# Patient Record
Sex: Female | Born: 1995 | Hispanic: Yes | Marital: Single | Smoking: Never smoker
Health system: Southern US, Community
[De-identification: ages and names within clinical notes are randomized; demographics above are authoritative.]

---
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
## 2016-01-20 ENCOUNTER — Emergency Department (HOSPITAL_COMMUNITY): Payer: Commercial Managed Care - PPO

## 2016-01-20 ENCOUNTER — Encounter (HOSPITAL_COMMUNITY): Payer: Self-pay | Admitting: Emergency Medicine

## 2016-01-20 ENCOUNTER — Emergency Department (HOSPITAL_COMMUNITY)
Admission: EM | Admit: 2016-01-20 | Discharge: 2016-01-21 | Disposition: A | Discharge status: Home / Self Care | Payer: Commercial Managed Care - PPO | Attending: Emergency Medicine | Admitting: Emergency Medicine

## 2016-01-20 DIAGNOSIS — S098XXA Other specified injuries of head, initial encounter: Secondary | ICD-10-CM | POA: Diagnosis not present

## 2016-01-20 DIAGNOSIS — S161XXA Strain of muscle, fascia and tendon at neck level, initial encounter: Secondary | ICD-10-CM | POA: Diagnosis not present

## 2016-01-20 DIAGNOSIS — S0993XA Unspecified injury of face, initial encounter: Secondary | ICD-10-CM

## 2016-01-20 DIAGNOSIS — S3991XA Unspecified injury of abdomen, initial encounter: Secondary | ICD-10-CM | POA: Diagnosis not present

## 2016-01-20 DIAGNOSIS — R079 Chest pain, unspecified: Secondary | ICD-10-CM | POA: Insufficient documentation

## 2016-01-20 DIAGNOSIS — S298XXA Other specified injuries of thorax, initial encounter: Secondary | ICD-10-CM

## 2016-01-20 DIAGNOSIS — S060X1A Concussion with loss of consciousness of 30 minutes or less, initial encounter: Secondary | ICD-10-CM | POA: Diagnosis not present

## 2016-01-20 DIAGNOSIS — Y999 Unspecified external cause status: Secondary | ICD-10-CM | POA: Diagnosis not present

## 2016-01-20 DIAGNOSIS — S0990XA Unspecified injury of head, initial encounter: Secondary | ICD-10-CM | POA: Diagnosis present

## 2016-01-20 DIAGNOSIS — Y9389 Activity, other specified: Secondary | ICD-10-CM | POA: Insufficient documentation

## 2016-01-20 DIAGNOSIS — Y92410 Unspecified street and highway as the place of occurrence of the external cause: Secondary | ICD-10-CM | POA: Insufficient documentation

## 2016-01-20 LAB — I-STAT CHEM 8, ED
BUN: 11 mg/dL (ref 6–20)
CALCIUM ION: 1.16 mmol/L (ref 1.13–1.30)
CHLORIDE: 106 mmol/L (ref 101–111)
CREATININE: 0.8 mg/dL (ref 0.44–1.00)
GLUCOSE: 89 mg/dL (ref 65–99)
HCT: 39 % (ref 36.0–46.0)
Hemoglobin: 13.3 g/dL (ref 12.0–15.0)
Potassium: 3.7 mmol/L (ref 3.5–5.1)
SODIUM: 141 mmol/L (ref 135–145)
TCO2: 26 mmol/L (ref 0–100)

## 2016-01-20 MED ORDER — TETANUS-DIPHTH-ACELL PERTUSSIS 5-2.5-18.5 LF-MCG/0.5 IM SUSP
0.5000 mL | Freq: Once | INTRAMUSCULAR | Status: AC
  Administered 2016-01-20: 0.5 mL via INTRAMUSCULAR
  Filled 2016-01-20: qty 0.5
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
## 2016-01-21 LAB — I-STAT BETA HCG BLOOD, ED (MC, WL, AP ONLY)

## 2016-01-21 MED ORDER — ONDANSETRON HCL 4 MG/2ML IJ SOLN
4.0000 mg | Freq: Once | INTRAMUSCULAR | Status: AC
  Administered 2016-01-21: 4 mg via INTRAVENOUS
  Filled 2016-01-21: qty 2

## 2016-01-21 MED ORDER — FENTANYL CITRATE (PF) 100 MCG/2ML IJ SOLN
50.0000 ug | Freq: Once | INTRAMUSCULAR | Status: AC
  Administered 2016-01-21: 50 ug via INTRAVENOUS
  Filled 2016-01-21: qty 2

## 2016-01-21 MED ORDER — IOPAMIDOL (ISOVUE-300) INJECTION 61%
100.0000 mL | Freq: Once | INTRAVENOUS | Status: AC | PRN
  Administered 2016-01-21: 100 mL via INTRAVENOUS

## 2016-01-21 NOTE — ED Provider Notes (Signed)
Pt stable All imaging negative She is awake/alert, GCS 15 She is ambulatory No signs of facial trauma Denies dental injury Will d/c home Discussed return precautions   Zadie Rhineonald Smiley Birr, MD 01/21/16 [phone]

## 2016-01-21 NOTE — ED Notes (Signed)
Patient given paper scrubs.
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
## 2016-01-21 NOTE — ED Notes (Signed)
Patient feels nauseated. 
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
## 2018-12-18 ENCOUNTER — Emergency Department (HOSPITAL_COMMUNITY)
Admission: EM | Admit: 2018-12-18 | Discharge: 2018-12-18 | Disposition: A | Discharge status: Home / Self Care | Payer: Medicaid Other | Attending: Emergency Medicine | Admitting: Emergency Medicine

## 2018-12-18 ENCOUNTER — Encounter (HOSPITAL_COMMUNITY): Payer: Self-pay | Admitting: Emergency Medicine

## 2018-12-18 ENCOUNTER — Other Ambulatory Visit: Payer: Self-pay

## 2018-12-18 DIAGNOSIS — L299 Pruritus, unspecified: Secondary | ICD-10-CM | POA: Insufficient documentation

## 2018-12-18 DIAGNOSIS — R21 Rash and other nonspecific skin eruption: Secondary | ICD-10-CM | POA: Diagnosis not present

## 2018-12-18 MED ORDER — PREDNISONE 10 MG PO TABS: 40.0000 mg | ORAL_TABLET | Freq: Every day | ORAL | 0 refills | Status: AC

## 2018-12-18 MED ORDER — FAMOTIDINE 20 MG PO TABS: 20.0000 mg | ORAL_TABLET | Freq: Two times a day (BID) | ORAL | 0 refills | Status: AC | PRN

## 2018-12-18 MED ORDER — DIPHENHYDRAMINE HCL 25 MG PO TABS: 25.0000 mg | ORAL_TABLET | Freq: Four times a day (QID) | ORAL | 0 refills | Status: AC | PRN

## 2018-12-18 MED ORDER — PREDNISONE 20 MG PO TABS
60.0000 mg | ORAL_TABLET | Freq: Once | ORAL | Status: AC
  Administered 2018-12-18: 22:00:00 60 mg via ORAL
  Filled 2018-12-18: qty 3

## 2018-12-18 NOTE — ED Provider Notes (Signed)
[facility] EMERGENCY DEPARTMENT Provider Note   CSN: [id number] Arrival date & time: 12/18/18  2025    History   Chief Complaint Chief Complaint  Patient presents with  . Rash    HPI Megan Callahan is a 23 y.o. female presents for evaluation of acute onset, progressively worsening pruritic rash for 1 week.  She notes that the rash began around her cheeks and has since spread to her ears bilaterally.  Denies any pain or drainage.  No fevers or chills.  Denies facial swelling, difficulty breathing or swallowing, wheezing, shortness of breath, chest pain, abdominal pain, nausea, or vomiting.  She has applied a topical Benadryl cream without significant relief.  She denies any new soaps, shampoos, detergents, lotions, medications, or new food exposures.  No one at home has a similar rash.  She does note that her employer started using a new cleaning agent and she did touch her face after using it but is unsure if it is related.     The history is provided by the patient.    History reviewed. No pertinent past medical history.  There are no active problems to display for this patient.   History reviewed. No pertinent surgical history.   OB History   No obstetric history on file.      Home Medications    Prior to Admission medications   Medication Sig Start Date End Date Taking? Authorizing Provider  diphenhydrAMINE (BENADRYL) 25 MG tablet Take 1 tablet (25 mg total) by mouth every 6 (six) hours as needed for itching. 12/18/18   Luevenia Maxin, Dominic Rhome A, PA-C  famotidine (PEPCID) 20 MG tablet Take 1 tablet (20 mg total) by mouth 2 (two) times daily as needed (itching). 12/18/18   Shivaay Stormont A, PA-C  predniSONE (DELTASONE) 10 MG tablet Take 4 tablets (40 mg total) by mouth daily for 5 days. 12/18/18 12/23/18  Jeanie Sewer, PA-C    Family History No family history on file.  Social History Social History   Tobacco Use  . Smoking status: Never Smoker  . Smokeless  tobacco: Never Used  Substance Use Topics  . Alcohol use: Never    Frequency: Never  . Drug use: Never     Allergies   Patient has no known allergies.   Review of Systems Review of Systems  Constitutional: Negative for fever.  HENT: Negative for sore throat, trouble swallowing and voice change.   Cardiovascular: Negative for chest pain.  Gastrointestinal: Negative for abdominal pain, nausea and vomiting.  Skin: Positive for rash.     Physical Exam Updated Vital Signs BP 130/74   Pulse 81   Temp 98.3 F (36.8 C)   Resp 16   Ht 5' 7.5" (1.715 m)   Wt 86.2 kg   SpO2 100%   BMI 29.32 kg/m   Physical Exam Vitals signs and nursing note reviewed.  Constitutional:      General: She is not in acute distress.    Appearance: She is well-developed.  HENT:     Head: Normocephalic and atraumatic.     Mouth/Throat:     Comments: No periorbital or tongue swelling, no upper airway stridor.  No abnormal phonation.  Tolerating secretions that difficulty.  No uvular swelling Eyes:     General:        Right eye: No discharge.        Left eye: No discharge.     Conjunctiva/sclera: Conjunctivae normal.  Neck:     Vascular:  No JVD.     Trachea: No tracheal deviation.  Cardiovascular:     Rate and Rhythm: Normal rate and regular rhythm.  Pulmonary:     Effort: Pulmonary effort is normal.     Breath sounds: Normal breath sounds. No wheezing.  Abdominal:     General: There is no distension.     Palpations: Abdomen is soft.     Tenderness: There is no abdominal tenderness. There is no guarding or rebound.  Skin:    General: Skin is warm and dry.     Findings: Erythema and rash present.     Comments: See below images.  Patient with an erythematous scaly rash localized to the ears and cheeks and chin bilaterally.  No desquamation, no vesicles, no bulla.  Nikolsky sign absent.  Neurological:     Mental Status: She is alert.  Psychiatric:        Behavior: Behavior normal.             ED Treatments / Results  Labs (all labs ordered are listed, but only abnormal results are displayed) Labs Reviewed - No data to display  EKG None  Radiology No results found.  Procedures Procedures (including critical care time)  Medications Ordered in ED Medications  predniSONE (DELTASONE) tablet 60 mg (60 mg Oral Given 12/18/18 2147)     Initial Impression / Assessment and Plan / ED Course  I have reviewed the triage vital signs and the nursing notes.  Pertinent labs & imaging results that were available during my care of the patient were reviewed by me and considered in my medical decision making (see chart for details).        Rash suggestive of allergic reaction versus contact dermatitis.  Patient is afebrile, vital signs are stable.  She is nontoxic in appearance.  Patient denies any difficulty breathing or swallowing.  Patient has a patent airway without stridor and is handling secretions without difficulty; no angioedema. No blisters, no pustules, no warmth, no draining sinus tracts, no superficial abscesses, no bullous impetigo, no vesicles, no desquamation, no target lesions with dusky purpura or a central bulla. Not tender to touch. No concern for superimposed infection. No concern for SJS, TEN, TSS, tick borne illness, syphilis or other life-threatening condition. Will discharge home with short course of steroids, pepcid and recommend Benadryl as needed for pruritis.  Discussed strict ED return precautions.  Patient verbalized understanding of and agreement with plan and patient stable for discharge home at this time.  Final Clinical Impressions(s) / ED Diagnoses   Final diagnoses:  Rash    ED Discharge Orders         Ordered    predniSONE (DELTASONE) 10 MG tablet  Daily     12/18/18 2142    diphenhydrAMINE (BENADRYL) 25 MG tablet  Every 6 hours PRN     12/18/18 2142    famotidine (PEPCID) 20 MG tablet  2 times daily PRN     12/18/18 2142            Jeanie SewerFawze, Carlie Corpus A, PA-C 12/18/18 2256    Cathren LaineSteinl, Kevin, MD 12/19/18 1512

## 2018-12-18 NOTE — Discharge Instructions (Signed)
Start taking prednisone as prescribed beginning tomorrow.  You received the first dose in the emergency department today.  You can take Benadryl every 6 hours as needed for itching.  Do not drive if this medication makes you drowsy.  You can also take Pepcid twice daily if the itching is still persistent.  Avoid use of any irritants.  Follow-up with a primary care physician for reevaluation of symptoms if they persist.  Return to the emergency department immediately if any concerning signs or symptoms develop such as facial swelling, difficulty breathing or swallowing, wheezing

## 2018-12-18 NOTE — ED Triage Notes (Signed)
Reports rash on face going around to ears for the last week.

## 2019-01-17 ENCOUNTER — Encounter (HOSPITAL_COMMUNITY): Payer: Self-pay | Admitting: *Deleted

## 2019-01-17 ENCOUNTER — Emergency Department (HOSPITAL_COMMUNITY)
Admission: EM | Admit: 2019-01-17 | Discharge: 2019-01-17 | Disposition: A | Discharge status: Home / Self Care | Payer: Medicaid Other | Attending: Emergency Medicine | Admitting: Emergency Medicine

## 2019-01-17 ENCOUNTER — Other Ambulatory Visit: Payer: Self-pay

## 2019-01-17 DIAGNOSIS — Z20828 Contact with and (suspected) exposure to other viral communicable diseases: Secondary | ICD-10-CM | POA: Diagnosis not present

## 2019-01-17 DIAGNOSIS — Z20822 Contact with and (suspected) exposure to covid-19: Secondary | ICD-10-CM

## 2019-01-17 DIAGNOSIS — J029 Acute pharyngitis, unspecified: Secondary | ICD-10-CM

## 2019-01-17 DIAGNOSIS — R070 Pain in throat: Secondary | ICD-10-CM | POA: Diagnosis present

## 2019-01-17 LAB — POC URINE PREG, ED: Preg Test, Ur: NEGATIVE

## 2019-01-17 LAB — GROUP A STREP BY PCR: Group A Strep by PCR: NOT DETECTED

## 2019-01-17 MED ORDER — KETOROLAC TROMETHAMINE 15 MG/ML IJ SOLN
15.0000 mg | Freq: Once | INTRAMUSCULAR | Status: AC
  Administered 2019-01-17: 15 mg via INTRAMUSCULAR
  Filled 2019-01-17: qty 1

## 2019-01-17 NOTE — ED Provider Notes (Signed)
[facility] EMERGENCY DEPARTMENT Provider Note   CSN: [id number] Arrival date & time: 01/17/19  1058   History   Chief Complaint Sore throat   HPI Megan Callahan is a 23 y.o. female with past medical history who presents for evaluation of sore throat.  Patient states she developed a sore throat yesterday evening.  Pain located to her bilateral sides of her throat.  Patient states she is been able to tolerate p.o. intake at home without difficulty however her throat feels "scratchy."  Patient states that her husband recently tested positive for COVID 3 days ago.  He has been isolating at home with patient.  Patient states she is concern for COVID as a sore throat was how his symptoms began.  She denies fever, chills, nausea, vomiting, congestion, rhinorrhea, drooling, dysphasia, trismus, cough, hemoptysis, chest pain, shortness of breath, abdominal pain, diarrhea dysuria.  Patient is 5 months postpartum and has not had a return of her menstrual cycle.  She is not breastfeeding. She rates her current pain a 8/10.  Denies radiation of pain.  Pain constant since onset.  History obtained from patient and past medical records.  No interpreter was used.     HPI  History reviewed. No pertinent past medical history.  There are no active problems to display for this patient.   History reviewed. No pertinent surgical history.   OB History   No obstetric history on file.      Home Medications    Prior to Admission medications   Medication Sig Start Date End Date Taking? Authorizing Provider  diphenhydrAMINE (BENADRYL) 25 MG tablet Take 1 tablet (25 mg total) by mouth every 6 (six) hours as needed for itching. 12/18/18   Nils Flack, Mina A, PA-C  famotidine (PEPCID) 20 MG tablet Take 1 tablet (20 mg total) by mouth 2 (two) times daily as needed (itching). 12/18/18   Renita Papa, PA-C    Family History History reviewed. No pertinent family history.  Social History Social  History   Tobacco Use  . Smoking status: Never Smoker  . Smokeless tobacco: Never Used  Substance Use Topics  . Alcohol use: Never    Frequency: Never  . Drug use: Never     Allergies   Patient has no known allergies.   Review of Systems Review of Systems  Constitutional: Negative.   HENT: Positive for sore throat. Negative for congestion, dental problem, drooling, ear discharge, ear pain, facial swelling, nosebleeds, postnasal drip, rhinorrhea, sinus pressure, sinus pain, sneezing, tinnitus, trouble swallowing and voice change.   Respiratory: Negative.   Cardiovascular: Negative.   Gastrointestinal: Negative.   Genitourinary: Negative.   Musculoskeletal: Negative.   Skin: Negative.   Neurological: Negative.   All other systems reviewed and are negative.    Physical Exam Updated Vital Signs BP 126/79 (BP Location: Right Arm)   Pulse 100   Temp 98.1 F (36.7 C) (Oral)   Ht 5\' 7"  (1.702 m)   Wt 86.2 kg   LMP 12/03/2018   SpO2 100%   BMI 29.76 kg/m   Physical Exam Vitals signs and nursing note reviewed.  Constitutional:      General: She is not in acute distress.    Appearance: She is not ill-appearing, toxic-appearing or diaphoretic.  HENT:     Head: Normocephalic and atraumatic.     Jaw: There is normal jaw occlusion.     Right Ear: Tympanic membrane, ear canal and external ear normal.  Left Ear: Tympanic membrane, ear canal and external ear normal.     Ears:     Comments: No Mastoid tenderness.    Nose:     Comments: No rhinorrhea and congestion to bilateral nares.  No sinus tenderness.    Mouth/Throat:     Lips: Pink. No lesions.     Mouth: Mucous membranes are moist.     Dentition: Normal dentition.     Palate: No lesions.     Pharynx: Uvula midline. Posterior oropharyngeal erythema present. No pharyngeal swelling, oropharyngeal exudate or uvula swelling.     Tonsils: No tonsillar exudate or tonsillar abscesses. 0 on the right. 0 on the left.      Comments: Posterior oropharynx erythematous without exudate.  Mucous membranes moist.  Tonsils without erythema or exudate.  Uvula midline without deviation.  No evidence of PTA or RPA.  No drooling, dysphasia or trismus.  Phonation normal. Neck:     Musculoskeletal: Full passive range of motion without pain and normal range of motion.     Trachea: Trachea and phonation normal.     Comments: No Neck stiffness or neck rigidity. No cervical lymphadenopathy. Cardiovascular:     Pulses: Normal pulses.     Heart sounds: Normal heart sounds.     Comments: No murmurs rubs or gallops. Pulmonary:     Breath sounds: Normal breath sounds and air entry.     Comments: Clear to auscultation bilaterally without wheeze, rhonchi or rales.  No accessory muscle usage.  Able speak in full sentences. Abdominal:     Comments: Soft, nontender without rebound or guarding.  No CVA tenderness.  Musculoskeletal:     Comments: Moves all 4 extremities without difficulty.  Lower extremities without edema, erythema or warmth.  Skin:    Comments: Brisk capillary refill.  No rashes or lesions.  Neurological:     Mental Status: She is alert.     Comments: Ambulatory in department without difficulty.  Cranial nerves II through XII grossly intact.  No facial droop.  No aphasia.    ED Treatments / Results  Labs (all labs ordered are listed, but only abnormal results are displayed) Labs Reviewed  GROUP A STREP BY PCR  NOVEL CORONAVIRUS, NAA (HOSPITAL ORDER, SEND-OUT TO REF LAB)  POC URINE PREG, ED    EKG None  Radiology No results found.  Procedures Procedures (including critical care time)  Medications Ordered in ED Medications  ketorolac (TORADOL) 15 MG/ML injection 15 mg (has no administration in time range)     Initial Impression / Assessment and Plan / ED Course  I have reviewed the triage vital signs and the nursing notes.  Pertinent labs & imaging results that were available during my care of the  patient were reviewed by me and considered in my medical decision making (see chart for details).  23 year old female appears otherwise well presents for evaluation of sore throat.  Afebrile, nonseptic, non-ill-appearing.  Tolerating p.o. intake at home without difficulty.  Heart and lungs clear.  Posterior oropharynx erythematous without exudate.  Uvula midline without deviation.  No drooling, dysphasia or trismus.  Sublingual area soft.  No facial swelling.  No submandibular swelling or evidence of Ludwig's angina.  No evidence of PTA or RPA.  No oral lesions.  Tonsils without erythema or exudate.  No neck stiffness or neck rigidity.  No meningismus.  Patient husband recently tested positive for COVID-19 3 days ago.  She appears overall well.  Will obtain strep and outpatient COVID  testing.  She is ambulatory without difficulty.  Was recently 5 months postpartum and has not resumed her menstrual cycle.  Will obtain urine pregnancy.  She is not breast-feeding.  HCG negative Strep negative COVID pending  Patient with likely with viral pharyngitis versus COVID infection.  She is tolerating p.o. intake in ED without difficulty.  No respiratory symptoms. She is afebrile without tachycardia, tachypnea or hypoxia.  She appears overall well.  Presentation non concerning for PTA or RPA. No trismus or uvula deviation. Specific return precautions discussed. Recommended PCP follow up.   The patient has been appropriately medically screened and/or stabilized in the ED. I have low suspicion for any other emergent medical condition which would require further screening, evaluation or treatment in the ED or require inpatient management.  Patient is hemodynamically stable and in no acute distress.  Patient able to ambulate in department prior to ED.  Evaluation does not show acute pathology that would require ongoing or additional emergent interventions while in the emergency department or further inpatient treatment.   I have discussed the diagnosis with the patient and answered all questions.  Patient has no further complaints prior to discharge.  Patient is comfortable with plan discussed in room and is stable for discharge at this time.  I have discussed strict return precautions for returning to the emergency department.  Patient was encouraged to follow-up with PCP/specialist refer to at discharge.     Joie BimlerMaria C Cuen was evaluated in Emergency Department on 01/17/2019 for the symptoms described in the history of present illness. She was evaluated in the context of the global COVID-19 pandemic, which necessitated consideration that the patient might be at risk for infection with the SARS-CoV-2 virus that causes COVID-19. Institutional protocols and algorithms that pertain to the evaluation of patients at risk for COVID-19 are in a state of rapid change based on information released by regulatory bodies including the CDC and federal and state organizations. These policies and algorithms were followed during the patient's care in the ED. Final Clinical Impressions(s) / ED Diagnoses   Final diagnoses:  Close Exposure to Covid-19 Virus  Sore throat    ED Discharge Orders    None       Octavius Shin A, PA-C 01/17/19 1402    Little, Ambrose Finlandachel Morgan, MD 01/18/19 1240

## 2019-01-17 NOTE — ED Triage Notes (Signed)
Pt reports a sore throat since last night. Pain 9/10

## 2019-01-17 NOTE — Discharge Instructions (Addendum)
Strep test and pregnancy test were negative.  This could be a viral pharyngitis or COVID-19.  Your COVID-19 test will take 24 to 48 hours to return.  If you develop chest pain, shortness of breath please seek reevaluation.  You will be notified if you are positive.  I would suggest Tylenol, ibuprofen as well as warm salt water gargles.

## 2019-01-17 NOTE — ED Triage Notes (Signed)
All info intered under author Gordan Payment  Entered I   error

## 2019-01-19 LAB — NOVEL CORONAVIRUS, NAA (HOSP ORDER, SEND-OUT TO REF LAB; TAT 18-24 HRS)

## 2019-01-28 ENCOUNTER — Emergency Department (HOSPITAL_COMMUNITY)
Admission: EM | Admit: 2019-01-28 | Discharge: 2019-01-28 | Disposition: A | Discharge status: Home / Self Care | Payer: Medicaid Other | Attending: Emergency Medicine | Admitting: Emergency Medicine

## 2019-01-28 ENCOUNTER — Encounter (HOSPITAL_COMMUNITY): Payer: Self-pay | Admitting: Emergency Medicine

## 2019-01-28 ENCOUNTER — Other Ambulatory Visit: Payer: Self-pay

## 2019-01-28 DIAGNOSIS — Z20828 Contact with and (suspected) exposure to other viral communicable diseases: Secondary | ICD-10-CM | POA: Diagnosis not present

## 2019-01-28 DIAGNOSIS — R197 Diarrhea, unspecified: Secondary | ICD-10-CM | POA: Diagnosis not present

## 2019-01-28 DIAGNOSIS — J029 Acute pharyngitis, unspecified: Secondary | ICD-10-CM | POA: Insufficient documentation

## 2019-01-28 DIAGNOSIS — R0602 Shortness of breath: Secondary | ICD-10-CM | POA: Diagnosis not present

## 2019-01-28 DIAGNOSIS — Z20822 Contact with and (suspected) exposure to covid-19: Secondary | ICD-10-CM

## 2019-01-28 NOTE — ED Triage Notes (Signed)
Pt here for eval of Covid symptoms. Pt states her husband had it. She has a sore throat, shortness of breath and diarrhea. 100% on room air.

## 2019-01-28 NOTE — Discharge Instructions (Signed)
Please obtain testing at the outpatient resources which were provided to you during office.  Please continue taking Tylenol for your symptoms.  If you experience any shortness of breath, worsening symptoms please return to the emergency department.

## 2019-02-27 NOTE — ED Provider Notes (Signed)
[facility] EMERGENCY [facility]PART[facility] Provider Note   CSN: [id number] Arrival date & time: 01/28/19  1634     History   Chief Complaint No chief complaint on file.   HPI Megan Callahan is a 23 y.o. female.     23 y.o female with no pertinent PMH presents tot he ED with a chief complaint of evaluation for Kopit symptoms.  Patient reports she was seen here a couple of days ago, had all the symptoms and was tested however results were never called for her.  She reports her husband is currently suffering from COVID-19 infection.  She also endorses some sore throat, shortness of breath along with diarrhea.  Patient reports taking Tylenol to help with her symptoms without improvement.  She denies any previous history of PEs, cough, rhinorrhea, fevers.   The history is provided by the patient and medical records.    History reviewed. No pertinent past medical history.  There are no active problems to display for this patient.   History reviewed. No pertinent surgical history.   OB History   No obstetric history on file.      Home Medications    Prior to Admission medications   Medication Sig Start Date End Date Taking? Authorizing Provider  diphenhydrAMINE (BENADRYL) 25 MG tablet Take 1 tablet (25 mg total) by mouth every 6 (six) hours as needed for itching. 12/18/18   Luevenia MaxinFawze, Mina A, PA-C  famotidine (PEPCID) 20 MG tablet Take 1 tablet (20 mg total) by mouth 2 (two) times daily as needed (itching). 12/18/18   Jeanie SewerFawze, Mina A, PA-C    Family History No family history on file.  Social History Social History   Tobacco Use  . Smoking status: Never Smoker  . Smokeless tobacco: Never Used  Substance Use Topics  . Alcohol use: Never    Frequency: Never  . Drug use: Never     Allergies   Patient has no known allergies.   Review of Systems Review of Systems  Constitutional: Negative for fever.  HENT: Positive for sore throat.   Respiratory: Positive for  shortness of breath.   Cardiovascular: Negative for chest pain.  Gastrointestinal: Negative for abdominal pain.     Physical Exam Updated Vital Signs BP 111/71   Pulse 80   Temp 97.8 F (36.6 C)   Resp 20   LMP 12/29/2018   SpO2 99%   Physical Exam Vitals signs and nursing note reviewed.  Constitutional:      General: She is not in acute distress.    Appearance: Normal appearance. She is well-developed.  HENT:     Head: Normocephalic and atraumatic.     Nose: Congestion present. No rhinorrhea.     Mouth/Throat:     Pharynx: No oropharyngeal exudate.     Comments: Slight erythema noted to the oropharynx. Eyes:     Pupils: Pupils are equal, round, and reactive to light.  Neck:     Musculoskeletal: Normal range of motion.  Cardiovascular:     Rate and Rhythm: Normal rate and regular rhythm.     Heart sounds: Normal heart sounds.  Pulmonary:     Effort: Pulmonary effort is normal. No respiratory distress.     Breath sounds: Normal breath sounds. No wheezing, rhonchi or rales.  Abdominal:     General: Bowel sounds are normal. There is no distension.     Palpations: Abdomen is soft.     Tenderness: There is no abdominal tenderness.  Musculoskeletal:  General: No tenderness or deformity.     Right lower leg: No edema.     Left lower leg: No edema.  Skin:    General: Skin is warm and dry.  Neurological:     Mental Status: She is alert and oriented to person, place, and time.      ED Treatments / Results  Labs (all labs ordered are listed, but only abnormal results are displayed) Labs Reviewed - No data to display  EKG None  Radiology No results found.  Procedures Procedures (including critical care time)  Medications Ordered in ED Medications - No data to display   Initial Impression / Assessment and Plan / ED Course  I have reviewed the triage vital signs and the nursing notes.  Pertinent labs & imaging results that were available during my  care of the patient were reviewed by me and considered in my medical decision making (see chart for details).      Patient presents to the ED with complaints of sore throat, shortness of breath, overall body aches.  Reports her husband tested positive for COVID-19 in late June, states she was tested however she never received the results.  During primary evaluation patient is well-appearing, lungs are clear to auscultation, does have some erythema on the oropharynx along with some congestion.  Vital signs are within normal limits, she satting at 100% on room air.  Patient is requesting a second Kopit testing.  Discussed resources for outpatient testing, patient reports "will I be charged for a second Kopit test today ".  Patient outpatient resources, that way she can get tested and not obtain a ER bill.  According to her visit on June 28, Kopit testing done, went trying to look up results they showed ",".  Her strep test was negative, unlikely to repeat this, oropharynx appears erythematous but without exudates, any signs of PTA.  Discussed outpatient resources with patient, states she will go there in order to get retested as her test was inconclusive.  Patient reports she is worried about her children who could be exposed to this virus, advised patient to bring children into the pediatric emergency room.  Patient with stable vital signs, and stable condition discharged from emergency department.  Megan Callahan was evaluated in Emergency Department on 02/27/2019 for the symptoms described in the history of present illness. She was evaluated in the context of the global COVID-19 pandemic, which necessitated consideration that the patient might be at risk for infection with the SARS-CoV-2 virus that causes COVID-19. Institutional protocols and algorithms that pertain to the evaluation of patients at risk for COVID-19 are in a state of rapid change based on information released by regulatory bodies including  the CDC and federal and state organizations. These policies and algorithms were followed during the patient's care in the ED.   Portions of this note were generated with [employer]. Dictation errors may occur despite best attempts at proofreading.    Final Clinical Impressions(s) / ED Diagnoses   Final diagnoses:  Suspected Covid-19 Virus Infection    ED Discharge Orders    None       Janeece Fitting, Hershal Coria 02/27/19 2008    Maudie Flakes, MD 03/01/19 [phone]
